# Patient Record
Sex: Female | Born: 1970 | Race: White | Hispanic: No | Marital: Married | Smoking: Current some day smoker
Health system: Southern US, Community
[De-identification: ages and names within clinical notes are randomized; demographics above are authoritative.]

---

## 1998-05-30 ENCOUNTER — Ambulatory Visit (HOSPITAL_COMMUNITY): Admission: RE | Admit: 1998-05-30 | Discharge: 1998-05-30 | Payer: Self-pay | Admitting: General Surgery

## 1998-06-12 ENCOUNTER — Ambulatory Visit (HOSPITAL_COMMUNITY): Admission: EM | Admit: 1998-06-12 | Discharge: 1998-06-12 | Payer: Self-pay | Admitting: Emergency Medicine

## 1998-09-08 ENCOUNTER — Other Ambulatory Visit: Admission: RE | Admit: 1998-09-08 | Discharge: 1998-09-08 | Payer: Self-pay | Admitting: Family Medicine

## 1999-11-08 ENCOUNTER — Other Ambulatory Visit: Admission: RE | Admit: 1999-11-08 | Discharge: 1999-11-08 | Payer: Self-pay | Admitting: Family Medicine

## 2000-06-24 ENCOUNTER — Other Ambulatory Visit: Admission: RE | Admit: 2000-06-24 | Discharge: 2000-06-24 | Payer: Self-pay | Admitting: *Deleted

## 2000-10-29 ENCOUNTER — Ambulatory Visit (HOSPITAL_COMMUNITY): Admission: RE | Admit: 2000-10-29 | Discharge: 2000-10-29 | Payer: Self-pay | Admitting: Obstetrics and Gynecology

## 2001-01-14 ENCOUNTER — Inpatient Hospital Stay (HOSPITAL_COMMUNITY): Admission: AD | Admit: 2001-01-14 | Discharge: 2001-01-17 | Payer: Self-pay | Admitting: Obstetrics and Gynecology

## 2001-01-18 ENCOUNTER — Encounter: Admission: RE | Admit: 2001-01-18 | Discharge: 2001-02-17 | Payer: Self-pay | Admitting: Obstetrics and Gynecology

## 2002-03-01 ENCOUNTER — Other Ambulatory Visit: Admission: RE | Admit: 2002-03-01 | Discharge: 2002-03-01 | Payer: Self-pay | Admitting: Obstetrics and Gynecology

## 2003-03-09 ENCOUNTER — Encounter: Admission: RE | Admit: 2003-03-09 | Discharge: 2003-03-09 | Payer: Self-pay | Admitting: Family Medicine

## 2003-03-09 ENCOUNTER — Encounter: Payer: Self-pay | Admitting: Family Medicine

## 2003-04-27 ENCOUNTER — Other Ambulatory Visit: Admission: RE | Admit: 2003-04-27 | Discharge: 2003-04-27 | Payer: Self-pay | Admitting: Obstetrics and Gynecology

## 2003-07-11 ENCOUNTER — Inpatient Hospital Stay (HOSPITAL_COMMUNITY): Admission: AD | Admit: 2003-07-11 | Discharge: 2003-07-11 | Payer: Self-pay | Admitting: Obstetrics and Gynecology

## 2003-09-07 ENCOUNTER — Inpatient Hospital Stay (HOSPITAL_COMMUNITY): Admission: AD | Admit: 2003-09-07 | Discharge: 2003-09-07 | Payer: Self-pay | Admitting: Obstetrics and Gynecology

## 2004-02-23 ENCOUNTER — Other Ambulatory Visit: Admission: RE | Admit: 2004-02-23 | Discharge: 2004-02-23 | Payer: Self-pay | Admitting: Obstetrics and Gynecology

## 2004-06-25 ENCOUNTER — Ambulatory Visit (HOSPITAL_COMMUNITY): Admission: RE | Admit: 2004-06-25 | Discharge: 2004-06-25 | Payer: Self-pay | Admitting: Obstetrics and Gynecology

## 2004-07-17 ENCOUNTER — Inpatient Hospital Stay (HOSPITAL_COMMUNITY): Admission: AD | Admit: 2004-07-17 | Discharge: 2004-07-17 | Payer: Self-pay | Admitting: Obstetrics and Gynecology

## 2004-07-23 ENCOUNTER — Encounter: Admission: RE | Admit: 2004-07-23 | Discharge: 2004-07-23 | Payer: Self-pay | Admitting: Obstetrics and Gynecology

## 2004-07-24 ENCOUNTER — Encounter: Admission: RE | Admit: 2004-07-24 | Discharge: 2004-07-24 | Payer: Self-pay | Admitting: Obstetrics and Gynecology

## 2004-07-30 ENCOUNTER — Encounter: Admission: RE | Admit: 2004-07-30 | Discharge: 2004-07-30 | Payer: Self-pay | Admitting: Obstetrics and Gynecology

## 2004-08-06 ENCOUNTER — Encounter: Admission: RE | Admit: 2004-08-06 | Discharge: 2004-08-06 | Payer: Self-pay | Admitting: Obstetrics and Gynecology

## 2004-08-15 ENCOUNTER — Ambulatory Visit: Payer: Self-pay | Admitting: Obstetrics and Gynecology

## 2004-08-21 ENCOUNTER — Ambulatory Visit: Payer: Self-pay | Admitting: *Deleted

## 2004-08-22 ENCOUNTER — Encounter (INDEPENDENT_AMBULATORY_CARE_PROVIDER_SITE_OTHER): Payer: Self-pay | Admitting: Specialist

## 2004-08-22 ENCOUNTER — Inpatient Hospital Stay (HOSPITAL_COMMUNITY): Admission: RE | Admit: 2004-08-22 | Discharge: 2004-08-26 | Payer: Self-pay | Admitting: Obstetrics and Gynecology

## 2004-08-27 ENCOUNTER — Encounter: Admission: RE | Admit: 2004-08-27 | Discharge: 2004-09-26 | Payer: Self-pay | Admitting: Obstetrics and Gynecology

## 2004-10-01 ENCOUNTER — Other Ambulatory Visit: Admission: RE | Admit: 2004-10-01 | Discharge: 2004-10-01 | Payer: Self-pay | Admitting: Obstetrics and Gynecology

## 2004-11-20 ENCOUNTER — Encounter (INDEPENDENT_AMBULATORY_CARE_PROVIDER_SITE_OTHER): Payer: Self-pay | Admitting: *Deleted

## 2004-11-20 ENCOUNTER — Ambulatory Visit (HOSPITAL_COMMUNITY): Admission: RE | Admit: 2004-11-20 | Discharge: 2004-11-20 | Payer: Self-pay | Admitting: Obstetrics and Gynecology

## 2005-11-08 ENCOUNTER — Other Ambulatory Visit: Admission: RE | Admit: 2005-11-08 | Discharge: 2005-11-08 | Payer: Self-pay | Admitting: Obstetrics and Gynecology

## 2010-05-28 ENCOUNTER — Encounter: Admission: RE | Admit: 2010-05-28 | Discharge: 2010-05-28 | Payer: Self-pay | Admitting: Internal Medicine

## 2011-02-15 ENCOUNTER — Ambulatory Visit (HOSPITAL_COMMUNITY): Payer: Managed Care, Other (non HMO) | Attending: Cardiology

## 2011-02-15 DIAGNOSIS — I4949 Other premature depolarization: Secondary | ICD-10-CM | POA: Insufficient documentation

## 2011-02-15 DIAGNOSIS — R002 Palpitations: Secondary | ICD-10-CM | POA: Insufficient documentation

## 2011-04-26 NOTE — Op Note (Signed)
[facility]  Patient:    Olivia Aguilar, Olivia Aguilar                     MRN: [id number] Proc. Date: 01/14/01 Adm. Date:  [id number] Attending:  Trevor Iha                           Operative Report  PREOPERATIVE DIAGNOSIS:       1. Intrauterine pregnancy at 39 weeks.                               2. History of rectal surgery.  POSTOPERATIVE DIAGNOSIS:      1. Intrauterine pregnancy at 39 weeks.                               2. History of rectal surgery.  OPERATION:                    Primary low transverse cesarean section.  SURGEON:                      Trevor Iha, M.D.  ANESTHESIA:                   Spinal.  ESTIMATED BLOOD LOSS:         800 cc.  INDICATIONS:                  Ms. Loa Socks is a 40 year old, G2, P1, at 38 weeks with a history of rectal surgery. She is not in labor.  She presents today for primary low transverse cesarean section.  Informed consent was obtained.  FINDINGS AT THE TIME OF SURGERY: A viable female infant, Apgars 8/9, weight 6 pounds 14 ounces, pH arterial currently pending.  Normal tubes and ovaries were noted.  DESCRIPTION OF PROCEDURE:     After adequate analgesia, the patient was placed in the supine position.  She was sterilely prepped and draped with Hibiclens. Foley catheter was sterilely placed.  A Pfannenstiel skin incision was made two fingerbreadths above the pubic symphysis.  This was taken down sharply to the fascia and incised in the midline superiorly above the rectus muscle.  The rectus muscle was then separated sharply in midline.  Peritoneum was entered sharply.  Bladder blade was placed.  Uterine serosa was elevated and nicked fascia transversely.  Bladder flap was created and replaced by the bladder blade.  A low myotomy incision was made down to the infants vertex.  A vacuum extractor was easily placed on the infants occiput, delivered atraumatically.  Nares and pharynx were then  suctioned.  The infant was then delivered with a good cry noted.  The cord was clamped, and infant was handed to the pediatricians.  Cord blood was then obtained, placenta extracted manually. The uterus was exteriorized, wiped clean with a dry lap.  The myotomy incision was closed in two layers, the first being a running locking, the second being an imbricating layer of 0 Monocryl suture.  Good hemostasis was achieved.  The uterus was placed back in the peritoneal cavity and, after copious irrigation, adequate hemostasis was assured, the peritoneum was closed with 0 Monocryl. Rectus muscle plicated in the midline with 0 Monocryl as well.  Irrigation on skin applied, and, after adequate hemostasis,  the fascia was then closed in a single stitch of 0 Panacryl in a running fashion.  Irrigation was applied again, and, after adequate hemostasis was assured, the skin staples and Steri-Strips were applied.  The patient received 1 g of Cefotan, and, after delivering the placenta, sponge and instrument count was normal x 3.  The patient was stable on transfer to the recovery room. DD:  01/14/01 TD:  01/15/01 Job: [id number]

## 2011-04-26 NOTE — Op Note (Signed)
Olivia Aguilar, Olivia Aguilar              ACCOUNT NO.:  [id number]   MEDICAL RECORD NO.:  [id number]          PATIENT TYPE:  AMB   LOCATION:  SDC                           FACILITY:  [facility]   PHYSICIAN:  Dineen Kid. Rana Snare, M.D.    DATE OF BIRTH:  December 07, 1971   DATE OF PROCEDURE:  11/20/2004  DATE OF DISCHARGE:                                 OPERATIVE REPORT   PREOPERATIVE DIAGNOSES:  1.  Abnormal uterine bleeding.  2.  Endometrial clot or mass on sonohysterogram.   POSTOPERATIVE DIAGNOSES:  1.  Abnormal uterine bleeding.  2.  Endometrial clot or mass on sonohysterogram.   PROCEDURE:  Hysteroscopy, dilation, and evacuation.   SURGEON:  Dineen Kid. Rana Snare, M.D.   ANESTHESIA:  Monitored anesthetic care and paracervical block.   INDICATIONS:  [name] is a 40 year old G2, P2, status post cesarean  section of twins 2 months ago who presented to the office with persistent  bleeding.  She had no bleeding after the delivery for several weeks and then  began having irregular bleeding and it has not improved.  Underwent a  sonohysterogram which shows normal-appearing uterine corpus; however, lower  segment or lower cervical area, there was a large blood and a question of a  lower uterine or upper cervical polyp.  The patient presents today for  hysteroscopy, D&C for evaluation and removal of this.  Risks and benefits  were discussed and informed consent was obtained.   FINDINGS AT THE TIME OF SURGERY:  Normal-appearing uterine corpus, normal  appearing ostia, lower cervical polypoid tissue and clot.  Normal-appearing  cervix after D&C.   DESCRIPTION OF PROCEDURE:  After adequate analgesia, the patient was placed  in the dorsal lithotomy position.  She was sterilely prepped and draped.  The bladder was sterilely drained.  A Grave's speculum was placed.  Tenaculum was placed on the anterior lip of the cervix and the paracervical  block was placed with 1% Xylocaine with 1:100,000 epinephrine, 21 mL  total  used.  The uterus sounded to 8 cm, easily dilated to a #27 Pratt dilator.  The hysteroscope was inserted and the above findings were noted.  Curettage  was performed with a sharp curette, returning small fragment of tissue and  small areas of clot that appeared to be old clot from the lower uterine  segment.  After a gritty surface was throughout the endometrial cavity and  cervical area, reinsertion of the hysteroscope revealed a normal-appearing  cervix, normal-appearing uterine corpus and ostia.  The hysteroscope was  then removed, the tenaculum removed from the cervix, noted to be hemostatic.  The speculum was then removed then removed and the patient was transferred  to the recovery room in stable condition.  The patient received 1 g of  Rocephin preoperatively, 30 mg of Toradol postoperatively.  Sorbitol deficit  was 50 mL.  Estimated blood loss was less than 10 mL.  Sponge and instrument  count was normal x3.   DISPOSITION:  The patient will be discharged home with followup in the  office in 2-3 weeks and routine instruction sheet for D&C.  Told to return  for increased pain, fever, or bleeding.     Davi   DCL/MEDQ  D:  11/20/2004  T:  11/20/2004  Job:  [id number]

## 2011-04-26 NOTE — Discharge Summary (Signed)
[facility]  Patient:    Olivia Aguilar, Olivia Aguilar                     MRN: [id number] Adm. Date:  [id number] Disc. Date: [id number] Attending:  Trevor Iha Dictator:   Danie Chandler, R.N.                           Discharge Summary  ADMITTING DIAGNOSES:          1. Intrauterine pregnancy at [redacted] weeks                                  gestation.                               2. History of rectal surgery.  DISCHARGE DIAGNOSES:          1. Intrauterine pregnancy at [redacted] weeks                                  gestation.                               2. History of rectal surgery.  PROCEDURE ON January 14, 2001:             Primary low transverse cesarean section.  REASON FOR ADMISSION:         Please see dictated H&P.  HOSPITAL COURSE:              The patient was taken to the operating room and underwent the above named procedure without complication. This was productive of a viable female infant with Apgars of 8 at one minute and 9 at five minutes. Postoperatively on day #1, the patients hemoglobin was 9.3, hematocrit 26.5, and white blood cell count 11.0. She had good return of bowel function on this day and was tolerating a regular diet. She also had good pain control.  On postoperative day #2, she was ambulating well without difficulty and had no complaints. She was discharged home on postoperative day #3.  CONDITION ON DISCHARGE:       Good.  DIET:                         Regular as tolerated.  ACTIVITY:                     No heavy lifting. No driving. No vaginal entry.  DISCHARGE FOLLOWUP:           She is to follow up in the office in one to two weeks for incision check and she is to call for temperature greater than 100 degrees, persistent nausea or vomiting, heavy vaginal bleeding, and/or redness or drainage from the incision site.  DISCHARGE MEDICATIONS:        1. Prenatal vitamin one p.o. q.d.                               2. Tylenol or  Ibuprofen as needed for pain.  3. Phenergan 25 mg p.o. q.6h. p.r.n. nausea                                  and vomiting. Dispense #20. DD:  02/06/01 TD:  02/07/01 Job: [id number]

## 2011-04-26 NOTE — H&P (Signed)
[facility]  Patient:    Olivia Aguilar, Olivia Aguilar                       MRN: [id number] Attending:  Trevor Iha, M.D.                         History and Physical  DATE OF BIRTH:                07/24/1971  HISTORY OF PRESENT ILLNESS:   Olivia Aguilar is a 40 year old, G2, P1 at 66 weeks who presents for primary low segment transverse cesarean section. Her estimated of confinement is January 16, 2001 confirmed by early ultrasound. 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 Her pregnancy is otherwise uncomplicated.  PAST MEDICAL HISTORY:         Negative.  PAST SURGICAL HISTORY:        Anal surgery.  MEDICATIONS:                  Prenatal vitamins.  ALLERGIES:                    IODINE and ORAL KEFLEX makes her itch; however, she can tolerate IV ______ and penicillin without problems.  PHYSICAL EXAMINATION:  VITAL SIGNS:                  Blood pressure is 102/60, weight 155.  HEART:                        Regular rate and rhythm.  LUNGS:                        Clear to auscultation bilaterally.  ABDOMEN:                      Nondistended, nontender. It is gravida.  CERVIX:                       Closed, thick, and high.  IMPRESSION:                   Intrauterine pregnancy at 39 weeks, history of previous rectal surgery.  PLAN:                         Primary low segment transverse cesarean section. The risks and benefits were discussed at length. Informed consent was obtained. DD:  01/14/01 TD:  01/14/01 Job: [id number]

## 2011-04-26 NOTE — Op Note (Signed)
Olivia Aguilar, Olivia Aguilar                        ACCOUNT NO.:  [id number]   MEDICAL RECORD NO.:  [id number]                   PATIENT TYPE:  INP   LOCATION:                                       FACILITY:  [facility]   PHYSICIAN:  Dineen Kid. Rana Snare, M.D.                 DATE OF BIRTH:  May 24, 1971   DATE OF PROCEDURE:  08/22/2004  DATE OF DISCHARGE:                                 OPERATIVE REPORT   PREOPERATIVE DIAGNOSES:  Intra-uterine pregnancy at 36-4/7 weeks with twin  pregnancies and previous cesarean section with desired repeat and labor.   POSTOPERATIVE DIAGNOSES:  Intra-uterine pregnancy at 36-4/7 weeks with twin  pregnancies and previous cesarean section with desired repeat and labor.   PROCEDURE:  Repeat low segment transverse cesarean section.   SURGEON:  Dr. Candice Camp.   ANESTHESIA:  Spinal.   INDICATIONS:  Ms. Riebe is a 40 year old G3, P1, A1 with twins who  presented in early labor.  She is scheduled for cesarean section in two days  because of previous cesarean section and desired repeat.  Pregnancy has been  complicated by preterm labor.  They have had concordant growth and normal  fetal surveillance.  The risks and benefits were discussed.  Informed  consent was obtained.   FINDINGS AT TIME OF SURGERY:  Viable female infant, Apgars 8 and 9, weight 4  pounds 10 ounces.  Viable female infant, Apgars 8 and 9, weight 5 pounds 12  ounces.   DESCRIPTION OF PROCEDURE:  After adequate analgesia, the patient was placed  in the supine position with left lateral tilt.  She was sterilely prepped  and draped with Hibiclens solution.  A previous Pfannenstiel skin incision  was sharply excised.  It was taken down sharply to the fascia which was  incised transversely and extended superiorly and inferiorly down to the  rectus muscle which was separated sharply in the midline.  The peritoneum  was entered sharply.  The bladder flap was created and placed behind the  bladder blade.  A  low segment cervical incision was made down to amniotic  sac of twin A.  Amniotomy was performed.  The infant's vertex was delivered.  The nares and pharynx were suctioned.  The infant was then delivered, cord  clamped, cut and handed to the pediatricians for resuscitation.  Twin A was  female.  Apgars were 8 and 9.  Cord blood was obtained.  The cord was  clamped with an umbilical clamp.  Amniotomy was performed on twin B.  The  vertex was easily delivered.  The nares and pharynx were suctioned.  The  infant was then delivered, cord clamped, cut and handed to pediatricians for  resuscitation.  It is a viable female infant, Apgars were 8 and 9.  Cord blood  was obtained.  The placenta is extracted manually.  The uterus was  exteriorized, wiped clean with a dry  lap.  The myotomy incision was closed  in two layers, the first being a running locking and the second being an  imbricating layer of 0 Monocryl suture.  It was replaced back into the  peritoneal cavity and after a copious amount of irrigation, adequate  hemostasis was assured.  The peritoneum was closed with 3-0 Vicryl in a  running fashion.  The rectus muscles were approximated at the midline.  Irrigation was applied and after adequate hemostasis, the fascia was closed  with a #1 Vicryl in a running fashion.  Irrigation was again applied and  after adequate hemostasis, the skin was stapled and Steri-Strips applied.  The patient tolerated the procedure well, was stable on transfer to the  recovery room.  Sponge and instrument counts were correct x3.  The  patient received 1 gram of Rocephin after delivery of the placenta.  The  patient has a history of tolerating Cefotan very well and we used Rocephin  as a substitute.  Again, sponge and instrument counts were correct x3.  The  patient was stable on transfer to the recovery room.                                               Dineen Kid Rana Snare, M.D.    DCL/MEDQ  D:  08/22/2004  T:   08/22/2004  Job:  [id number]

## 2011-04-26 NOTE — Discharge Summary (Signed)
NAMEBRADLEIGH, Olivia Aguilar              ACCOUNT NO.:  [id number]   MEDICAL RECORD NO.:  [id number]          PATIENT TYPE:  INP   LOCATION:  9138                          FACILITY:  [facility]   PHYSICIAN:  Michelle L. Grewal, M.D.DATE OF BIRTH:  [birth year]   DATE OF ADMISSION:  08/22/2004  DATE OF DISCHARGE:  08/26/2004                                 DISCHARGE SUMMARY   ADMITTING DIAGNOSES:  1.  Intrauterine pregnancy at 73 and four-sevenths weeks estimated      gestational age.  2.  Twin gestation.  3.  Previous cesarean section.  4.  Labor.   DISCHARGE DIAGNOSES:  1.  Status post low transverse cesarean section.  2.  Viable female and female twin infants.   PROCEDURE:  Repeat low transverse cesarean section.   REASON FOR ADMISSION:  Please see H&P.   HOSPITAL COURSE:  The patient was a 40 year old gravida 4 para 1 that was  admitted to [facility] with a twin gestation at 24 and four-  sevenths weeks estimated gestational age in early labor.  The patient had  been scheduled for repeat cesarean in approximately 2 days.  The patient did  desire repeat cesarean and was admitted to [facility] for  labor and delivery.  The patient was then transferred to the operating room  where spinal anesthesia was administered without difficulty.  A low  transverse incision was made with the delivery of twin A, a girl, weighing 4  pounds 12 ounces with Apgars of 8 at one minute and 9 at five minutes.  Baby  B, a boy, was delivered weighing 5 pounds 10 ounces with Apgars of 8 at one  minute and 9 at five minutes.  The patient tolerated the procedure well and  was taken to the recovery room in stable condition.  On postoperative day  #1, the patient was without complaint.  Vital signs were stable, she was  afebrile.  Abdomen was soft with good return of bowel function.  Fundus was  firm and nontender.  Abdominal dressing was noted to be clean, dry, and  intact.  Lungs were  clear to auscultation.  Labs revealed hemoglobin of  10.4.  On postoperative day #2, the patient did complain of a slight frontal  headache without visual disturbance or right upper quadrant pain.  Vital  signs were stable, blood pressure 131/70 to 134/88, deep tendon reflexes  were 2+ without clonus.  Abdomen remained soft with good return of bowel  function.  Fundus was firm and nontender.  Abdominal dressing had been  removed revealing an incision that was clean, dry, and intact.  RhoGAM had  been administered and PIH labs were drawn.  On postoperative day #3, the  patient did complain of some diarrhea without PIH symptoms.  Blood pressure  remained 130s to 140s over 80s to 90s.  Uterus was nontender.  Incision was  clean, dry, and intact.  Extremities were noted to have a small amount of  edema in the lower extremities.  PIH labs were within normal limits.  Decision was made to continue with observation of  the blood pressures and  delay discharge until the following morning.  On postoperative day #4, the  patient was feeling better.  Blood pressure was 126/80, other vital signs  were stable.  Fundus was firm and nontender.  Incision was clean, dry, and  intact.  Staples were removed and the patient was discharged home.   CONDITION ON DISCHARGE:  Stable.   DIET:  Regular as tolerated.   ACTIVITY:  No heavy lifting, no driving x2 weeks, no vaginal entry.   FOLLOW-UP:  The patient is to follow up in the office in 1 week for an  incision check.  She is to call for temperature greater than 100 degrees,  persistent nausea and vomiting, heavy vaginal bleeding, and/or redness or  drainage from the incisional site.  The patient was also encouraged to call  for headache unrelieved by Tylenol, blurred vision or right upper quadrant  pain.   DISCHARGE MEDICATIONS:  1.  Tylox #30 one p.o. q.4-6h. p.r.n.  2.  Motrin 600 mg q.6h.  3.  Prenatal vitamins one p.o. daily.  4.  Colace one p.o.  daily p.r.n.     Catalina Gravel   CC/MEDQ  D:  09/07/2004  T:  09/07/2004  Job:  [id number]

## 2011-04-26 NOTE — H&P (Signed)
NAMEGERI, HEPLER                        ACCOUNT NO.:  [id number]   MEDICAL RECORD NO.:  [id number]                   PATIENT TYPE:  INP   LOCATION:                                       FACILITY:  [facility]   PHYSICIAN:  Dineen Kid. Rana Snare, M.D.                 DATE OF BIRTH:  [birth year]   DATE OF ADMISSION:  08/22/2004  DATE OF DISCHARGE:                                HISTORY & PHYSICAL   HISTORY OF PRESENT ILLNESS:  Ms. Olivia Aguilar is a 40 year old, G4, P1, A2 with  twin pregnancy at 36-4/7 weeks who presented to my office in early labor.  She has been contracting all through the night.  Her cervix is now changed  to 150%, -3.  She is scheduled for a repeat cesarean section in two days due  to twin pregnancies and term.  She desires a repeat cesarean section.  Her  pregnancy has been uncomplicated, other than preterm labor.  She has vertex,  vertex twins that have grown appropriately with an [facility] of September 16, 2004.  She is Rh negative.  She received RhoGAM on June 25, 2004 for 28 weeks.   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  She has had a history of HSV.   PAST SURGICAL HISTORY:  She has had a D&E, laparoscopy.  She has also had  rectal surgery and a cesarean section.   PHYSICAL EXAMINATION:  VITAL SIGNS:  Blood pressure 124/70.  HEART:  Regular rate and rhythm.  LUNGS:  Clear to auscultation bilaterally.  ABDOMEN:  Gravid, nontender, 42 cm in size.  Cervix is 150%, -3.  Fetal  heart rates are in the 140's to 150's.  She is contracting every 5-10  minutes.   IMPRESSION AND PLAN:  Twin pregnancies, concordant twins at 36-4/7 weeks in  early labor.  The plan is for a repeat low transverse cesarean section due  to history of cesarean section and history of surgery.  The risks and  benefits were discussed at length which include but are not limited to the  risk of infection, bleeding, damage to uterus, tubes or ovaries,  bowel or  bladder.  Risks of prematurity or injury to the fetus.  She does give her  informed consent and wished to proceed.                                               Dineen Kid Rana Snare, M.D.    DCL/MEDQ  D:  08/22/2004  T:  08/22/2004  Job:  [id number]

## 2011-11-12 ENCOUNTER — Other Ambulatory Visit: Payer: Self-pay | Admitting: Dermatology

## 2012-02-14 ENCOUNTER — Other Ambulatory Visit: Payer: Self-pay | Admitting: Gastroenterology

## 2013-03-20 ENCOUNTER — Encounter (HOSPITAL_BASED_OUTPATIENT_CLINIC_OR_DEPARTMENT_OTHER): Payer: Self-pay

## 2013-03-20 ENCOUNTER — Emergency Department (HOSPITAL_BASED_OUTPATIENT_CLINIC_OR_DEPARTMENT_OTHER)
Admission: EM | Admit: 2013-03-20 | Discharge: 2013-03-20 | Disposition: A | Discharge status: Home / Self Care | Payer: Managed Care, Other (non HMO) | Attending: Emergency Medicine | Admitting: Emergency Medicine

## 2013-03-20 DIAGNOSIS — Z3202 Encounter for pregnancy test, result negative: Secondary | ICD-10-CM | POA: Insufficient documentation

## 2013-03-20 DIAGNOSIS — R209 Unspecified disturbances of skin sensation: Secondary | ICD-10-CM | POA: Insufficient documentation

## 2013-03-20 DIAGNOSIS — R202 Paresthesia of skin: Secondary | ICD-10-CM

## 2013-03-20 DIAGNOSIS — F17200 Nicotine dependence, unspecified, uncomplicated: Secondary | ICD-10-CM | POA: Insufficient documentation

## 2013-03-20 LAB — URINALYSIS, ROUTINE W REFLEX MICROSCOPIC
Nitrite: NEGATIVE
Specific Gravity, Urine: 1.008 (ref 1.005–1.030)
Urobilinogen, UA: 0.2 mg/dL (ref 0.0–1.0)

## 2013-03-20 LAB — CBC WITH DIFFERENTIAL/PLATELET
Eosinophils Absolute: 0 10*3/uL (ref 0.0–0.7)
Lymphocytes Relative: 17 % (ref 12–46)
Lymphs Abs: 1.2 10*3/uL (ref 0.7–4.0)
MCH: 30.9 pg (ref 26.0–34.0)
Neutro Abs: 5 10*3/uL (ref 1.7–7.7)
Neutrophils Relative %: 73 % (ref 43–77)
Platelets: 273 10*3/uL (ref 150–400)
RBC: 3.85 MIL/uL — ABNORMAL LOW (ref 3.87–5.11)
WBC: 6.8 10*3/uL (ref 4.0–10.5)

## 2013-03-20 LAB — BASIC METABOLIC PANEL
GFR calc non Af Amer: 88 mL/min — ABNORMAL LOW (ref >90)
Glucose, Bld: 110 mg/dL — ABNORMAL HIGH (ref 70–99)
Potassium: 4.7 mEq/L (ref 3.5–5.1)
Sodium: 141 mEq/L (ref 135–145)

## 2013-03-20 LAB — PREGNANCY, URINE: Preg Test, Ur: NEGATIVE

## 2013-03-20 LAB — URINE MICROSCOPIC-ADD ON

## 2013-03-20 NOTE — ED Provider Notes (Signed)
History     CSN: [id number]  Arrival date & time 03/20/13  1310   First MD Initiated Contact with Patient 03/20/13 1506      Chief Complaint  Patient presents with  . Numbness    (Consider location/radiation/quality/duration/timing/severity/associated sxs/prior treatment) HPI Comments: Patient presents with a two-week history of intermittent paresthesias. She states that she has numbness to her fingers of her hand including mostly the fourth and fifth fingers of both hands. She also has some intermittent tingling in her feet. She denies a weakness in her arms or legs. She has occasional muscle twitches in her legs. She denies any neck or back pain. She denies any headache or vision changes. She denies any speech difficulties. She denies he fevers or chills or other recent illnesses.   History reviewed. No pertinent past medical history.  Past Surgical History  Procedure Laterality Date  . Cesarean section  2002 and 2005    History reviewed. No pertinent family history.  History  Substance Use Topics  . Smoking status: Current Some Day Smoker    Types: Cigarettes  . Smokeless tobacco: Never Used  . Alcohol Use: Yes     Comment: social    OB History   Grav Para Term Preterm Abortions TAB SAB Ect Mult Living                  Review of Systems  Constitutional: Negative for fever, chills, diaphoresis and fatigue.  HENT: Negative for congestion, rhinorrhea and sneezing.   Eyes: Negative.   Respiratory: Negative for cough, chest tightness and shortness of breath.   Cardiovascular: Negative for chest pain and leg swelling.  Gastrointestinal: Negative for nausea, vomiting, abdominal pain, diarrhea and blood in stool.  Genitourinary: Negative for frequency, hematuria, flank pain and difficulty urinating.  Musculoskeletal: Negative for back pain and arthralgias.  Skin: Negative for rash.  Neurological: Positive for numbness. Negative for dizziness, speech difficulty, weakness  and headaches.    Allergies  Iohexol  Home Medications   Current Outpatient Rx  Name  Route  Sig  Dispense  Refill  . ibuprofen (ADVIL,MOTRIN) 200 MG tablet   Oral   Take 200 mg by mouth every 6 (six) hours as needed for pain.           BP 135/86  Pulse 78  Temp(Src) 98.4 F (36.9 C) (Oral)  Resp 18  Ht 5\' 6"  (1.676 m)  Wt 135 lb (61.236 kg)  BMI 21.8 kg/m2  SpO2 97%  LMP 03/18/2013  Physical Exam  Constitutional: She is oriented to person, place, and time. She appears well-developed and well-nourished.  HENT:  Head: Normocephalic and atraumatic.  Eyes: Pupils are equal, round, and reactive to light.  Neck: Normal range of motion. Neck supple.  Cardiovascular: Normal rate, regular rhythm and normal heart sounds.   Pulmonary/Chest: Effort normal and breath sounds normal. No respiratory distress. She has no wheezes. She has no rales. She exhibits no tenderness.  Abdominal: Soft. Bowel sounds are normal. There is no tenderness. There is no rebound and no guarding.  Musculoskeletal: Normal range of motion. She exhibits no edema.  Lymphadenopathy:    She has no cervical adenopathy.  Neurological: She is alert and oriented to person, place, and time. She has normal strength. No cranial nerve deficit or sensory deficit. GCS eye subscore is 4. GCS verbal subscore is 5. GCS motor subscore is 6.  FTN intact  Skin: Skin is warm and dry. No rash noted.  Psychiatric: She  has a normal mood and affect.    ED Course  Procedures (including critical care time)  Results for orders placed during the hospital encounter of 03/20/13  CBC WITH DIFFERENTIAL      Result Value Range   WBC 6.8  4.0 - 10.5 K/uL   RBC 3.85 (*) 3.87 - 5.11 MIL/uL   Hemoglobin 11.9 (*) 12.0 - 15.0 g/dL   HCT 40.9 (*) 81.1 - 91.4 %   MCV 93.2  78.0 - 100.0 fL   MCH 30.9  26.0 - 34.0 pg   MCHC 33.1  30.0 - 36.0 g/dL   RDW 78.2  95.6 - 21.3 %   Platelets 273  150 - 400 K/uL   Neutrophils Relative 73  43 -  77 %   Neutro Abs 5.0  1.7 - 7.7 K/uL   Lymphocytes Relative 17  12 - 46 %   Lymphs Abs 1.2  0.7 - 4.0 K/uL   Monocytes Relative 9  3 - 12 %   Monocytes Absolute 0.6  0.1 - 1.0 K/uL   Eosinophils Relative 0  0 - 5 %   Eosinophils Absolute 0.0  0.0 - 0.7 K/uL   Basophils Relative 0  0 - 1 %   Basophils Absolute 0.0  0.0 - 0.1 K/uL  BASIC METABOLIC PANEL      Result Value Range   Sodium 141  135 - 145 mEq/L   Potassium 4.7  3.5 - 5.1 mEq/L   Chloride 107  96 - 112 mEq/L   CO2 26  19 - 32 mEq/L   Glucose, Bld 110 (*) 70 - 99 mg/dL   BUN 15  6 - 23 mg/dL   Creatinine, Ser 0.86  0.50 - 1.10 mg/dL   Calcium 9.6  8.4 - 57.8 mg/dL   GFR calc non Af Amer 88 (*) >90 mL/min   GFR calc Af Amer >90  >90 mL/min  URINALYSIS, ROUTINE W REFLEX MICROSCOPIC      Result Value Range   Color, Urine YELLOW  YELLOW   APPearance CLEAR  CLEAR   Specific Gravity, Urine 1.008  1.005 - 1.030   pH 7.5  5.0 - 8.0   Glucose, UA NEGATIVE  NEGATIVE mg/dL   Hgb urine dipstick MODERATE (*) NEGATIVE   Bilirubin Urine NEGATIVE  NEGATIVE   Ketones, ur NEGATIVE  NEGATIVE mg/dL   Protein, ur NEGATIVE  NEGATIVE mg/dL   Urobilinogen, UA 0.2  0.0 - 1.0 mg/dL   Nitrite NEGATIVE  NEGATIVE   Leukocytes, UA NEGATIVE  NEGATIVE  PREGNANCY, URINE      Result Value Range   Preg Test, Ur NEGATIVE  NEGATIVE  URINE MICROSCOPIC-ADD ON      Result Value Range   Squamous Epithelial / LPF RARE  RARE   WBC, UA 0-2  <3 WBC/hpf   RBC / HPF 11-20  <3 RBC/hpf   Bacteria, UA RARE  RARE   No results found.    1. Paresthesias       MDM  Patient presents with paresthesias. She has no weakness in her arms or other suggestion of nerve impingement. She has no CVA symptoms. She denies any pain or radicular type symptoms. I advised her followup with her primary care physician if her symptoms continue or return here as needed for any worsening symptoms.        Rolan Bucco, MD 03/20/13 [phone]

## 2013-03-20 NOTE — ED Notes (Signed)
Pt states that she has bilateral upper and lower extremity tingling.  Pt states that she "does not have numbness necessarily" but that there is a constant tingling which is worse in the left side more so than the the right.

## 2013-03-20 NOTE — ED Notes (Signed)
Pt states she has been feeling numbness in her ext off and on x 1 week. Increased numbness and some nausea today. Denies other s/s. Neuro WNL.

## 2013-03-20 NOTE — ED Notes (Signed)
D/c home with ride- No new rx given 

## 2013-05-04 ENCOUNTER — Other Ambulatory Visit: Payer: Self-pay | Admitting: Obstetrics and Gynecology

## 2013-05-04 DIAGNOSIS — R928 Other abnormal and inconclusive findings on diagnostic imaging of breast: Secondary | ICD-10-CM

## 2013-05-07 ENCOUNTER — Ambulatory Visit
Admission: RE | Admit: 2013-05-07 | Discharge: 2013-05-07 | Disposition: A | Discharge status: Home / Self Care | Payer: Managed Care, Other (non HMO) | Source: Ambulatory Visit | Attending: Obstetrics and Gynecology | Admitting: Obstetrics and Gynecology

## 2013-05-07 DIAGNOSIS — R928 Other abnormal and inconclusive findings on diagnostic imaging of breast: Secondary | ICD-10-CM

## 2013-05-14 ENCOUNTER — Other Ambulatory Visit: Payer: Managed Care, Other (non HMO)

## 2014-04-13 ENCOUNTER — Ambulatory Visit (INDEPENDENT_AMBULATORY_CARE_PROVIDER_SITE_OTHER): Payer: Managed Care, Other (non HMO) | Admitting: Family Medicine

## 2014-04-13 ENCOUNTER — Encounter (INDEPENDENT_AMBULATORY_CARE_PROVIDER_SITE_OTHER): Payer: Self-pay

## 2014-04-13 ENCOUNTER — Encounter: Payer: Self-pay | Admitting: Family Medicine

## 2014-04-13 VITALS — BP 133/85 | HR 94 | Ht 66.0 in | Wt 133.0 lb

## 2014-04-13 DIAGNOSIS — M545 Low back pain, unspecified: Secondary | ICD-10-CM

## 2014-04-13 MED ORDER — PREDNISONE (PAK) 10 MG PO TABS: ORAL_TABLET | ORAL | Status: DC

## 2014-04-13 NOTE — Assessment & Plan Note (Signed)
consistent with disc bulge with radiculopathy.  No acute injury.  No red flags.  Will trial prednisone dose pack and transition to advil after this.  Start home exercise program which was reviewed today.  Consider physical therapy if not improving as expected.  Discussed decrease in activity level and slow progression from there.   F/u in 1 month for reevaluation.

## 2014-04-13 NOTE — Patient Instructions (Signed)
You have lumbar radiculopathy (a pinched nerve in your low back from a bulging disc). Take tylenol for baseline pain relief (1-2 extra strength tabs 3x/day) A prednisone dose pack is the best option for immediate relief and may be prescribed. Day after finishing prednisone start advil 600mg  three times a day with food for pain and inflammation. Consider Tramadol or norco as needed for severe pain (no driving on this medicine). Consider Flexeril as needed for muscle spasms (no driving on this medicine if it makes you sleepy). Stay as active as possible. Start home exercise program daily - pick 3-5 of the exercises on the handout and do for next 6 weeks. Physical therapy has been shown to be helpful as well - consider this if you're struggling. Strengthening of low back muscles, abdominal musculature are key for long term pain relief. If not improving, will consider further imaging (MRI). Follow up with me in 1 month.

## 2014-04-13 NOTE — Progress Notes (Signed)
Patient ID: Olivia Aguilar, female   DOB: 1971-01-15, 43 y.o.   MRN: [id number]  PCP: Hoyle SauerAVVA,RAVISANKAR R, MD  Subjective:   HPI: Patient is a 43 y.o. female here for low back pain.  Patient reports she does not recall an injury She does have a handicapped child who has to be picked up, carried a lot. Pain mostly central in low back. Does radiate at times to right side and down leg to about hip area. Felt better so she played tennis on Monday - no problems while doing this but later that night pain was worse. No numbness/tingling. No bowel/bladder dysfunction. Had some pain remotely when doing crossfit but resolved on its own without requiring any treatment.  History reviewed. No pertinent past medical history.  Current Outpatient Prescriptions on File Prior to Visit  Medication Sig Dispense Refill  . ibuprofen (ADVIL,MOTRIN) 200 MG tablet Take 200 mg by mouth every 6 (six) hours as needed for pain.       No current facility-administered medications on file prior to visit.    Past Surgical History  Procedure Laterality Date  . Cesarean section  2002 and 2005    Allergies  Allergen Reactions  . Iodine   . Iohexol   . Ivp Dye [Iodinated Diagnostic Agents]     History   Social History  . Marital Status: Married    Spouse Name: N/A    Number of Children: N/A  . Years of Education: N/A   Occupational History  . Not on file.   Social History Main Topics  . Smoking status: Current Some Day Smoker    Types: Cigarettes  . Smokeless tobacco: Never Used  . Alcohol Use: Yes     Comment: social  . Drug Use: No  . Sexual Activity: Yes    Birth Control/ Protection: None   Other Topics Concern  . Not on file   Social History Narrative  . No narrative on file    Family History  Problem Relation Age of Onset  . Heart attack Father     BP 133/85  Pulse 94  Ht 5\' 6"  (1.676 m)  Wt 133 lb (60.328 kg)  BMI 21.48 kg/m2  Review of Systems: See HPI above.     Objective:  Physical Exam:  Gen: NAD  Back: No gross deformity, scoliosis. No paraspinal TTP .  No midline or bony TTP. FROM but pain on flexion. Strength LEs 5/5 all muscle groups.   2+ MSRs in patellar and achilles tendons, equal bilaterally. Negative SLRs. Sensation intact to light touch bilaterally. Negative logroll bilateral hips Negative fabers and piriformis stretches.    Assessment & Plan:  1. Low back pain - consistent with disc bulge with radiculopathy.  No acute injury.  No red flags.  Will trial prednisone dose pack and transition to advil after this.  Start home exercise program which was reviewed today.  Consider physical therapy if not improving as expected.  Discussed decrease in activity level and slow progression from there.   F/u in 1 month for reevaluation.

## 2014-05-16 ENCOUNTER — Ambulatory Visit: Payer: Managed Care, Other (non HMO) | Admitting: Family Medicine

## 2014-05-19 ENCOUNTER — Ambulatory Visit (INDEPENDENT_AMBULATORY_CARE_PROVIDER_SITE_OTHER): Payer: Managed Care, Other (non HMO) | Admitting: Family Medicine

## 2014-05-19 ENCOUNTER — Encounter: Payer: Self-pay | Admitting: Family Medicine

## 2014-05-19 VITALS — BP 118/82 | HR 98 | Ht 66.0 in | Wt 135.0 lb

## 2014-05-19 DIAGNOSIS — M545 Low back pain, unspecified: Secondary | ICD-10-CM

## 2014-05-23 ENCOUNTER — Encounter: Payer: Self-pay | Admitting: Family Medicine

## 2014-05-23 NOTE — Assessment & Plan Note (Signed)
consistent with disc bulge with radiculopathy.  Much better following prednisone and home exercise program.  Slowly increase activity level, tennis.  Advil as needed.  Continue HEp for another 6 weeks at least.  Consider PT, imaging if not improving as expected.  F/u prn.

## 2014-05-23 NOTE — Progress Notes (Signed)
Patient ID: Olivia FredericksonLynn R Flippo, female   DOB: October 10, 1971, 43 y.o.   MRN: [id number]  PCP: Hoyle SauerAVVA,RAVISANKAR R, MD  Subjective:   HPI: Patient is a 43 y.o. female here for low back pain.  5/6: Patient reports she does not recall an injury She does have a handicapped child who has to be picked up, carried a lot. Pain mostly central in low back. Does radiate at times to right side and down leg to about hip area. Felt better so she played tennis on Monday - no problems while doing this but later that night pain was worse. No numbness/tingling. No bowel/bladder dysfunction. Had some pain remotely when doing crossfit but resolved on its own without requiring any treatment.  6/11: Patient reports she feels better compared to last visit. Able to play tennis today. Felt good without pain or tightness. Some soreness couple days ago into both hips. Finished prednisone, takes advil as needed. Doing HEP regularly. No numbness/tingling, bowel/bladder dysfunction.  History reviewed. No pertinent past medical history.  Current Outpatient Prescriptions on File Prior to Visit  Medication Sig Dispense Refill  . ibuprofen (ADVIL,MOTRIN) 200 MG tablet Take 200 mg by mouth every 6 (six) hours as needed for pain.      . predniSONE (STERAPRED UNI-PAK) 10 MG tablet 6 tabs po day 1, 5 tabs po day 2, 4 tabs po day 3, 3 tabs po day 4, 2 tabs po day 5, 1 tab po day 6  21 tablet  0   No current facility-administered medications on file prior to visit.    Past Surgical History  Procedure Laterality Date  . Cesarean section  2002 and 2005    Allergies  Allergen Reactions  . Iodine   . Iohexol   . Ivp Dye [Iodinated Diagnostic Agents]     History   Social History  . Marital Status: Married    Spouse Name: N/A    Number of Children: N/A  . Years of Education: N/A   Occupational History  . Not on file.   Social History Main Topics  . Smoking status: Current Some Day Smoker    Types: Cigarettes   . Smokeless tobacco: Never Used  . Alcohol Use: Yes     Comment: social  . Drug Use: No  . Sexual Activity: Yes    Birth Control/ Protection: None   Other Topics Concern  . Not on file   Social History Narrative  . No narrative on file    Family History  Problem Relation Age of Onset  . Heart attack Father     BP 118/82  Pulse 98  Ht 5\' 6"  (1.676 m)  Wt 135 lb (61.236 kg)  BMI 21.80 kg/m2  Review of Systems: See HPI above.    Objective:  Physical Exam:  Gen: NAD  Back: No gross deformity, scoliosis. No paraspinal TTP .  No midline or bony TTP. FROM without pain. Strength LEs 5/5 all muscle groups.   2+ MSRs in patellar and achilles tendons, equal bilaterally. Negative SLRs. Sensation intact to light touch bilaterally. Negative logroll bilateral hips Negative fabers and piriformis stretches.    Assessment & Plan:  1. Low back pain - consistent with disc bulge with radiculopathy.  Much better following prednisone and home exercise program.  Slowly increase activity level, tennis.  Advil as needed.  Continue HEp for another 6 weeks at least.  Consider PT, imaging if not improving as expected.  F/u prn.

## 2015-08-21 ENCOUNTER — Other Ambulatory Visit: Payer: Self-pay | Admitting: Obstetrics and Gynecology

## 2015-08-21 DIAGNOSIS — R928 Other abnormal and inconclusive findings on diagnostic imaging of breast: Secondary | ICD-10-CM

## 2015-08-24 ENCOUNTER — Ambulatory Visit
Admission: RE | Admit: 2015-08-24 | Discharge: 2015-08-24 | Disposition: A | Discharge status: Home / Self Care | Payer: Managed Care, Other (non HMO) | Source: Ambulatory Visit | Attending: Obstetrics and Gynecology | Admitting: Obstetrics and Gynecology

## 2015-08-24 DIAGNOSIS — R928 Other abnormal and inconclusive findings on diagnostic imaging of breast: Secondary | ICD-10-CM

## 2019-11-25 ENCOUNTER — Other Ambulatory Visit: Payer: Self-pay | Admitting: Family Medicine

## 2019-11-25 ENCOUNTER — Other Ambulatory Visit: Payer: Self-pay | Admitting: Obstetrics and Gynecology

## 2019-11-25 DIAGNOSIS — R928 Other abnormal and inconclusive findings on diagnostic imaging of breast: Secondary | ICD-10-CM

## 2019-12-06 ENCOUNTER — Ambulatory Visit
Admission: RE | Admit: 2019-12-06 | Discharge: 2019-12-06 | Disposition: A | Discharge status: Home / Self Care | Payer: Managed Care, Other (non HMO) | Source: Ambulatory Visit | Attending: Obstetrics and Gynecology | Admitting: Obstetrics and Gynecology

## 2019-12-06 ENCOUNTER — Other Ambulatory Visit: Payer: Self-pay

## 2019-12-06 ENCOUNTER — Other Ambulatory Visit: Payer: Self-pay | Admitting: Obstetrics and Gynecology

## 2019-12-06 ENCOUNTER — Ambulatory Visit: Payer: Managed Care, Other (non HMO)

## 2019-12-06 DIAGNOSIS — R928 Other abnormal and inconclusive findings on diagnostic imaging of breast: Secondary | ICD-10-CM

## 2020-11-05 IMAGING — MG MM DIGITAL DIAGNOSTIC UNILAT*R* W/ TOMO W/ CAD
6 series · 6 of 18 positions shown · non-contrast
Comparison: Previous exams including recent screening mammogram
dated 11/24/2019.

CLINICAL DATA: Patient returns today to evaluate a possible RIGHT
breast asymmetry questioned on recent screening mammogram.

EXAM:
DIGITAL DIAGNOSTIC UNILATERAL RIGHT MAMMOGRAM WITH CAD AND TOMO

[R MLO synth-2D (1 of 2)]
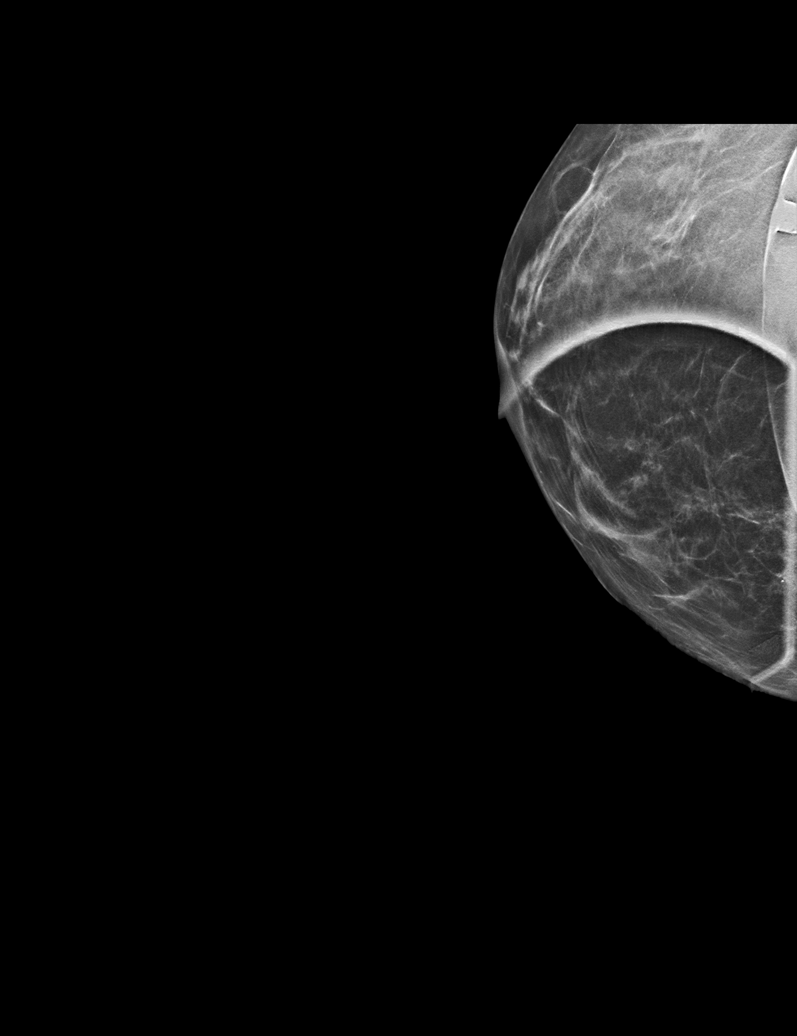

[R MLO synth-2D (2 of 2)]
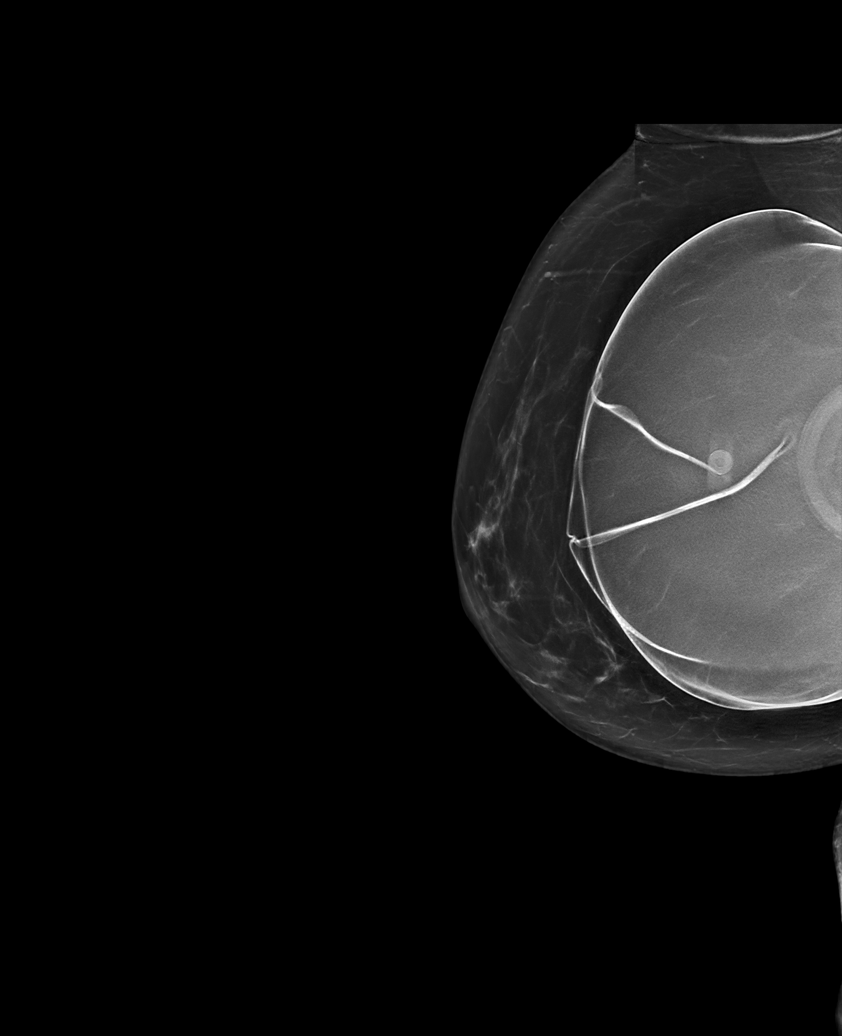

[R CC synth-2D]
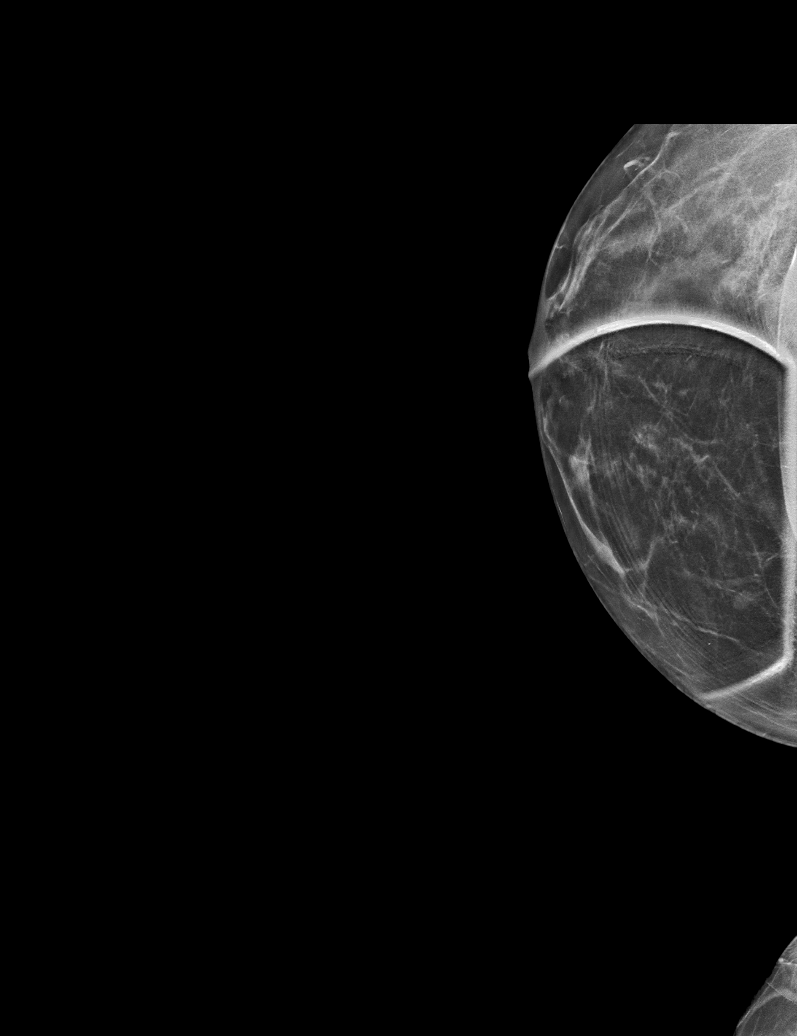

[R MLO tomo (1 of 2) · tomo slice 23/46.0]
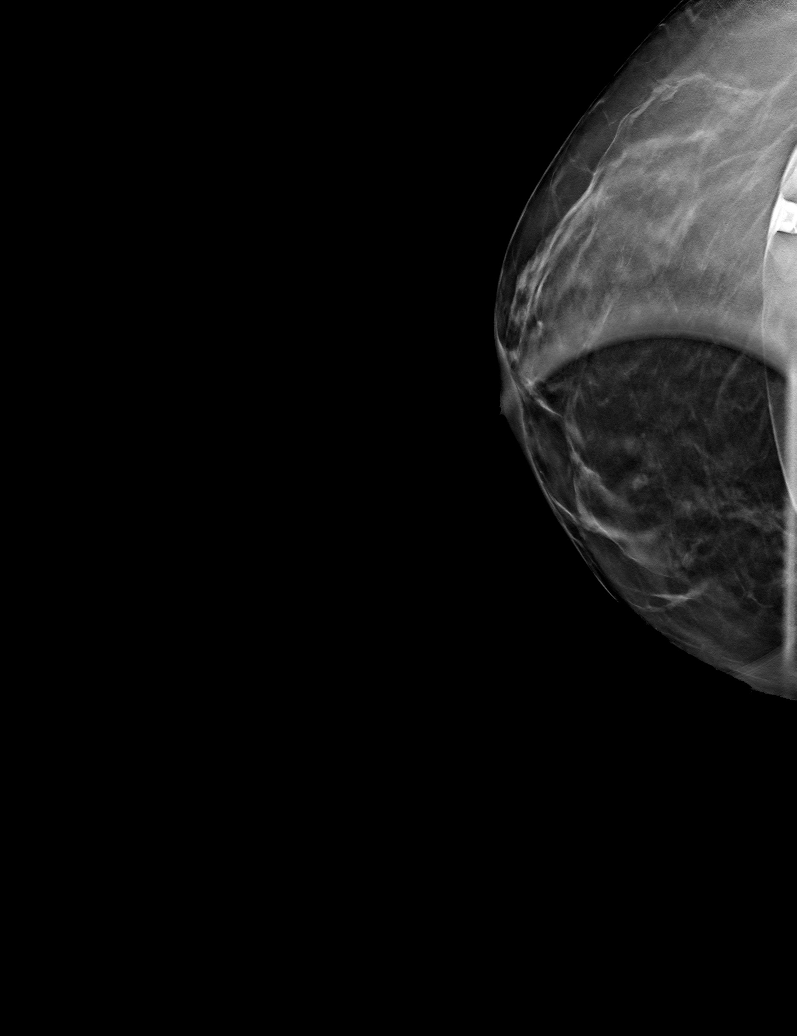

[R MLO tomo (2 of 2) · tomo slice 41/81.0]
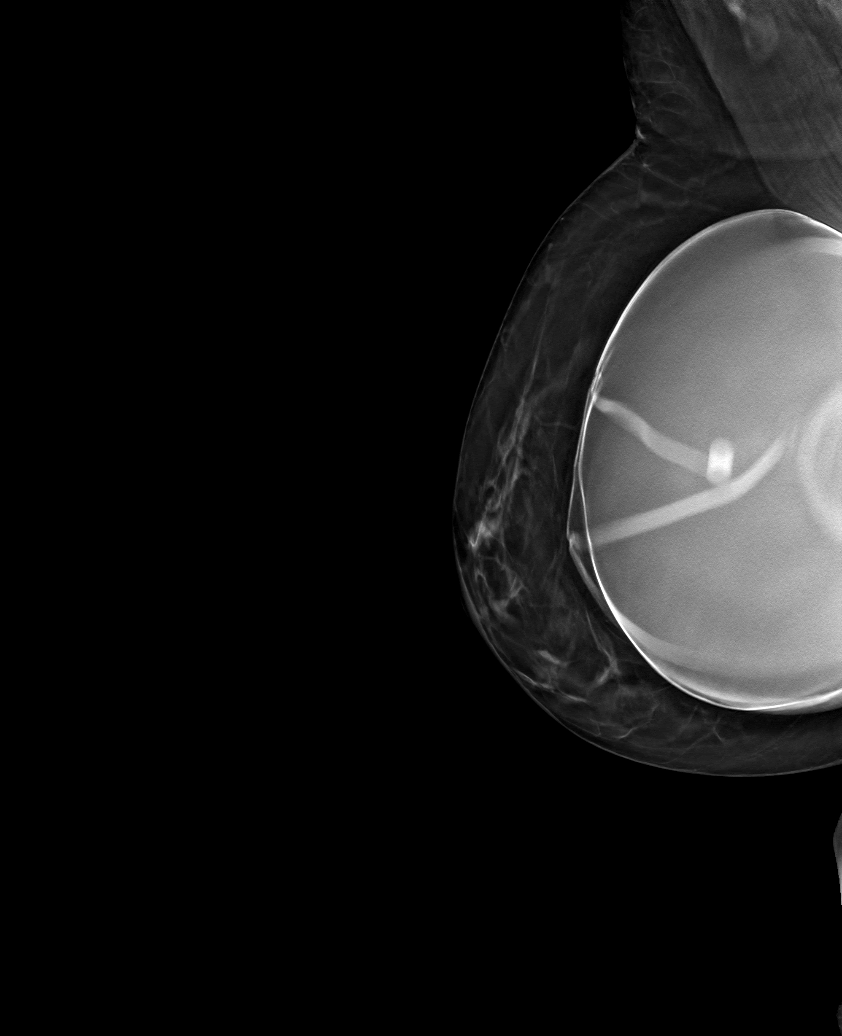

[R CC tomo · tomo slice 23/46.0]
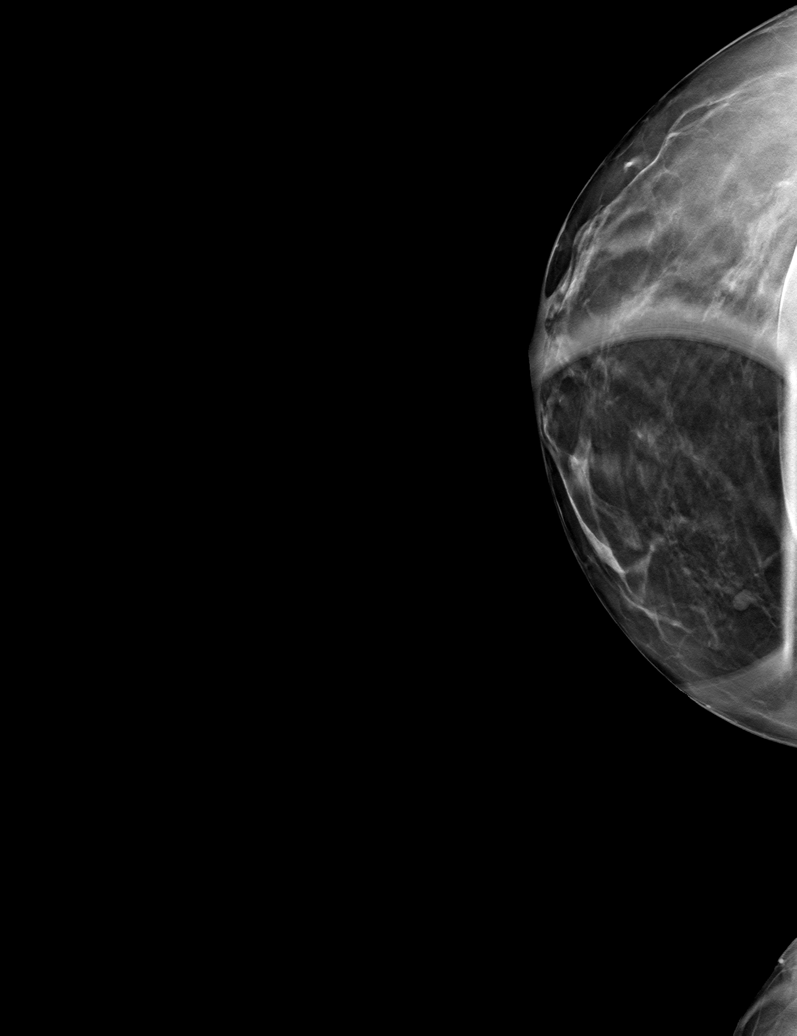

[6 of 18 positions shown; findings below may reference images not displayed]

ACR Breast Density Category b: There are scattered areas of
fibroglandular density.
FINDINGS: On today's additional diagnostic views, including spot compression
with 3D tomosynthesis, there is no persistent asymmetry within the
lower or inner RIGHT breast indicating superimposition of normal
fibroglandular tissues.

Mammographic images were processed with CAD.
IMPRESSION: No evidence of malignancy. Patient may return to routine annual
bilateral screening mammogram schedule.

RECOMMENDATION:
Screening mammogram in one year.(Code:[id number]-[id number])

I have discussed the findings and recommendations with the patient.
If applicable, a reminder letter will be sent to the patient
regarding the next appointment.

BI-RADS CATEGORY  1: Negative.

## 2020-11-23 ENCOUNTER — Ambulatory Visit (INDEPENDENT_AMBULATORY_CARE_PROVIDER_SITE_OTHER): Payer: Self-pay | Admitting: Plastic Surgery

## 2020-11-23 ENCOUNTER — Other Ambulatory Visit: Payer: Self-pay

## 2020-11-23 DIAGNOSIS — Z411 Encounter for cosmetic surgery: Secondary | ICD-10-CM

## 2020-11-23 NOTE — Progress Notes (Signed)
Patient presents to discuss nonsurgical rejuvenation of her face.  She is interested in filler and Botox.  She has gotten Botox in the past in the forehead, glabella and crows feet but is unsure of the amount.  She is also interested in treatment of the infraorbital tear trough areas.  On exam she has static and dynamic lines in the forehead, glabella and crows feet.  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  We discussed Botox injection for the forehead, glabella and crows feet and filler and laser resurfacing for the infraorbital area.  We discussed the risks and benefits of Botox and filler including bleeding bleeding, infection, damage surrounding structures and need for additional procedures.  All of her questions were answered and she elected to proceed.  Her face was prepped with an alcohol pad and 36 units of Botox were distributed between the forehead, glabella and crows feet.  1 cc of Restylane-L was split between the right and left infraorbital hollows with good results.  She will see how this does for her and then consider fractional resurfacing which I think would help tighten the skin in that area.  We did discuss a lower blepharoplasty but she is not interested in that at this point.  I did ask her to make a touchup appointment 2 weeks from now in case she needs it.

## 2020-12-07 ENCOUNTER — Encounter: Payer: Self-pay | Admitting: Plastic Surgery

## 2021-03-22 ENCOUNTER — Encounter: Payer: Self-pay | Admitting: Plastic Surgery

## 2021-03-22 ENCOUNTER — Ambulatory Visit (INDEPENDENT_AMBULATORY_CARE_PROVIDER_SITE_OTHER): Payer: Self-pay | Admitting: Plastic Surgery

## 2021-03-22 ENCOUNTER — Other Ambulatory Visit: Payer: Self-pay

## 2021-03-22 VITALS — BP 153/80 | HR 85

## 2021-03-22 DIAGNOSIS — Z411 Encounter for cosmetic surgery: Secondary | ICD-10-CM

## 2021-03-22 NOTE — Progress Notes (Signed)
Patient presents to discuss Botox treatment.  We did Botox and Restylane around the eyes about 4 months ago.  She was happy with the Botox and ultimately with the filler as well but got bruise under one of her eyes from the filler.  She is interested in just Botox today.  Last time she got 36 units and was happy with that.  She continues to have dynamic and static lines in the forehead, glabella and crows feet.  Those areas were prepped with alcohol pad and 36 units of Botox were distributed throughout after discussing the risks and benefits.  She tolerated this fine and we will plan to see her at her next visit.

## 2024-01-28 ENCOUNTER — Ambulatory Visit: Payer: No Typology Code available for payment source | Admitting: Internal Medicine

## 2024-02-19 ENCOUNTER — Ambulatory Visit (INDEPENDENT_AMBULATORY_CARE_PROVIDER_SITE_OTHER): Payer: No Typology Code available for payment source | Admitting: Internal Medicine

## 2024-02-19 ENCOUNTER — Encounter: Payer: Self-pay | Admitting: Internal Medicine

## 2024-02-19 VITALS — BP 100/60 | HR 82 | Temp 97.2°F | Resp 18 | Ht 65.5 in | Wt 134.3 lb

## 2024-02-19 DIAGNOSIS — Z7189 Other specified counseling: Secondary | ICD-10-CM

## 2024-02-19 DIAGNOSIS — L308 Other specified dermatitis: Secondary | ICD-10-CM | POA: Diagnosis not present

## 2024-02-19 DIAGNOSIS — Z872 Personal history of diseases of the skin and subcutaneous tissue: Secondary | ICD-10-CM | POA: Diagnosis not present

## 2024-02-19 MED ORDER — METRONIDAZOLE 0.75 % EX GEL: 1.0000 | Freq: Two times a day (BID) | CUTANEOUS | 0 refills | Status: AC

## 2024-02-19 NOTE — Addendum Note (Signed)
 Addended by: Ralph Leyden on: 02/19/2024 01:26 PM   Modules accepted: Orders

## 2024-02-19 NOTE — Patient Instructions (Addendum)
 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  - Continue tacrolimus 0.1% ointment twice daily for flares   -can use once daily on known hot spots until the weekend to keep controlled -Start metrogel once daily for flares  - Continue gentle skin care  - We will screen for alpha gal given concern, but I am not concerned for food allergy   -patient counseled on IgE mediated food allergy presentation, testing  - Schedule patch testing (Give a call after April 1st, for patch testing at our [place] office)  - Avoid intranasal steroids and topical steroids   Patches are best placed on Monday with return to office on Wednesday and Friday of same week for readings.  Patches once placed should not get wet.  You do not have to stop any medications for patch testing but should not be on oral prednisone. You can schedule a patch testing visit when convenient for your schedule.     Follow up: for patch testing   Thank you so much for letting me partake in your care today.  Don't hesitate to reach out if you have any additional concerns!  Ferol Luz, MD  [facility] [place], [place]

## 2024-02-19 NOTE — Progress Notes (Signed)
 NEW PATIENT Date of Service/Encounter:  02/19/24 Referring provider: Chilton Greathouse, MD Primary care provider: Chilton Greathouse, MD  Subjective:  Olivia Aguilar is a 53 y.o. female  presenting today for evaluation of rash around her mouth  History obtained from: chart review and patient.   Discussed the use of AI scribe software for clinical note transcription with the patient, who gave verbal consent to proceed.  History of Present Illness   The patient is a 53 year old who presents with a recurrent rash around the mouth, eyes, and hands.  Since June or July, she has experienced a recurrent rash initially appearing around the mouth and later spreading to the eyes and hands. The rash is characterized by itching, small erythematous papules,  sometimes extending to the lips, nose, and chest.    She has seen dermatology initially treated for periorificial dermatitis with tacrolimus ointment.  She did have some response to this however concerned about secondary infected due to honey colored crust.  At that point she was swabbed and was negative for yeast.  She did receive a biopsy which showed "dermatitis".  She has used various treatments, including a steroid cream, which initially improved the rash but did not resolve it completely. After discontinuing the steroid, the rash worsened temporarily. She was prescribed doxycycline, which was later stopped, and amoxicillin, which provided some improvement but the rash returned. Tacrolimus cream has been effective in alleviating symptoms, and she also uses a topical triple antibiotic ointment during flare-ups. Zyrtec at night helps with itching.  She is concerned that the rash might be related to dietary factors, particularly nuts, and has been avoiding them for the past five months. She has also expressed concern about a potential food allergy to alpha-gal due to recent tick bites. She has switched to fragrance-free skin products and avoids  retinol.  She gets SNS on her nails.  Her daughter is getting married in May and she is very concerned about the cosmetics.  Only 1 picture was available to review and that was during concern for impetigo, she was erythematous papules around lips with honey colored crusts Other allergy screening: Asthma: no Rhino conjunctivitis: no Food allergy: no Medication allergy: no Hymenoptera allergy: no Urticaria: no Eczema: dermatitis as above  History of recurrent infections suggestive of immunodeficency: no Vaccinations are up to date.   Past Medical History: History reviewed. No pertinent past medical history. Medication List:  Current Outpatient Medications  Medication Sig Dispense Refill   VITAMIN D PO Take by mouth.     ibuprofen (ADVIL,MOTRIN) 200 MG tablet Take 200 mg by mouth every 6 (six) hours as needed for pain.     No current facility-administered medications for this visit.   Known Allergies:  Allergies  Allergen Reactions   Iodine    Iohexol    Ivp Dye [Iodinated Contrast Media]    Past Surgical History: Past Surgical History:  Procedure Laterality Date   CESAREAN SECTION  2002 and 2005   Family History: Family History  Problem Relation Age of Onset   Heart attack Father    Allergic rhinitis Neg Hx    Angioedema Neg Hx    Asthma Neg Hx    Atopy Neg Hx    Eczema Neg Hx    Immunodeficiency Neg Hx    Urticaria Neg Hx    Social History: [name] lives single-family home is [address] years old.  No water damage in the house.  Hardwood's in family room carpet in bedroom.  Gas heating, central cooling.  3 dogs with access to bedroom.  No roaches.  Bed is 2 feet off the floor.  No dust mite precautions, no tobacco exposure Works as a Technical sales engineer.  Not exposed to fumes, chemicals or dust.   ROS:  All other systems negative except as noted per HPI.  Objective:  Blood pressure 100/60, pulse 82, temperature (!) 97.2 F (36.2 C), temperature source Temporal, resp. rate  18, height 5' 5.5" (1.664 m), weight 134 lb 4.8 oz (60.9 kg), SpO2 99%. Body mass index is 22.01 kg/m. Physical Exam:  General Appearance:  Alert, cooperative, no distress, appears stated age  Head:  Normocephalic, without obvious abnormality, atraumatic  Eyes:  Conjunctiva clear, EOM's intact  Ears EACs normal bilaterally  Nose: Nares normal,     Throat: Lips, tongue normal; teeth and gums normal,     Neck: Supple, symmetrical  Lungs:     , Respirations unlabored, no coughing  Heart:    , Appears well perfused  Extremities: No edema  Skin: Small erythematous papules on lips   Neurologic: No gross deficits   Diagnostics: None done    Labs:  Lab Orders         Alpha-Gal Panel       Assessment and Plan  Assessment and Plan  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  Patches once placed should not get wet.  You do not have to stop any medications for patch testing but should not be on oral prednisone. You can schedule a patch testing visit when convenient for your schedule.     Follow up: for patch testing     This note in its entirety was forwarded to the Provider who requested this consultation.  Other:     Thank you for your kind referral. I appreciate the opportunity to take part in Olivia Aguilar's care. Please do not hesitate to contact me with questions.  Sincerely,  Thank you so much for letting me partake in your care  today.  Don't hesitate to reach out if you have any additional concerns!  Ferol Luz, MD  [facility] , [place]

## 2024-02-24 ENCOUNTER — Encounter: Payer: Self-pay | Admitting: Internal Medicine

## 2024-02-25 LAB — ALPHA-GAL PANEL
Allergen Lamb IgE: <0.1 kU/L
Beef IgE: <0.1 kU/L
IgE (Immunoglobulin E), Serum: 123 [IU]/mL (ref 6–495)
O215-IgE Alpha-Gal: <0.1 kU/L
Pork IgE: <0.1 kU/L

## 2024-02-25 NOTE — Telephone Encounter (Signed)
 Alpha gal panel was negative.

## 2024-03-01 ENCOUNTER — Encounter: Payer: Self-pay | Admitting: *Deleted

## 2024-03-01 NOTE — Progress Notes (Signed)
 Alpha gal labs was negative.  Can someone let patient know?
